# Patient Record
Sex: Female | Born: 1980 | Race: White | Hispanic: No | Marital: Married
Health system: Southern US, Community
[De-identification: ages and names within clinical notes are randomized; demographics above are authoritative.]

---

## 2009-07-26 ENCOUNTER — Emergency Department: Payer: Self-pay | Admitting: Unknown Physician Specialty

## 2010-02-08 ENCOUNTER — Emergency Department: Payer: Self-pay | Admitting: Emergency Medicine

## 2011-08-10 IMAGING — CT CT ABD-PELV W/O CM
1 of 2 series · 15 of 32 positions shown, 19 images · non-contrast
Comparison: none

REASON FOR EXAM: (1) LUQ pain; (2) same
COMMENTS:   LMP: Now

[Series 2: soft tissue · axial · 0.77mm/px · z∈[-684,-285]mm · 15 of 147 slices shown, 19 images]
[im 7/147  soft-tissue]
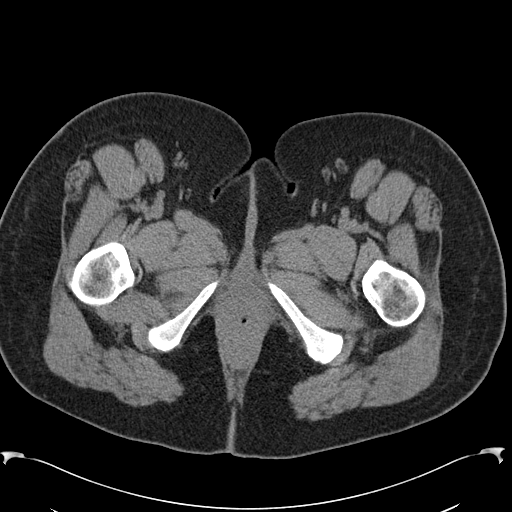
[im 7/147  bone]
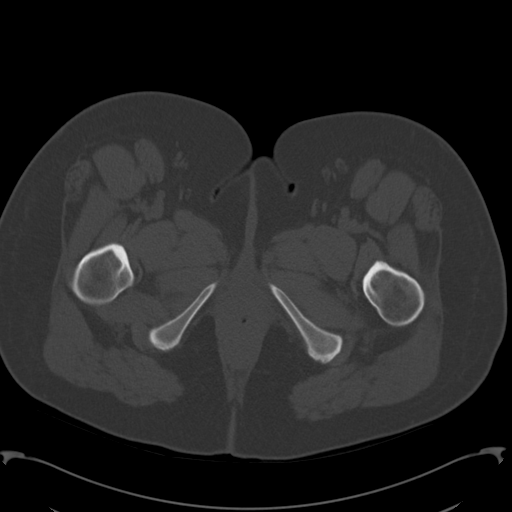
[im 19/147  soft-tissue]
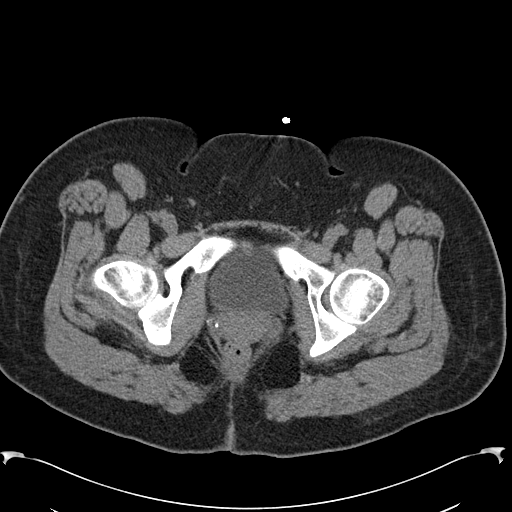
[im 31/147  soft-tissue]
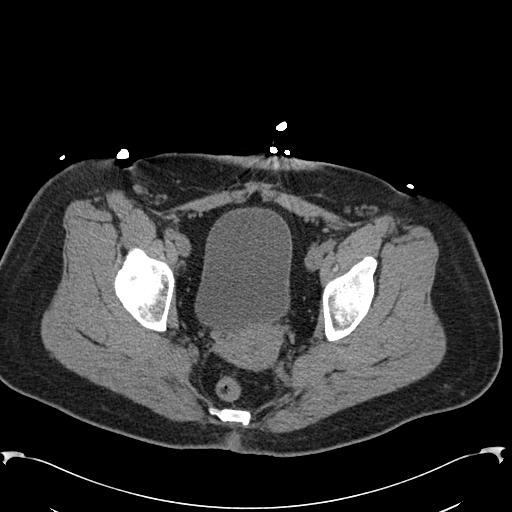
[im 43/147  soft-tissue]
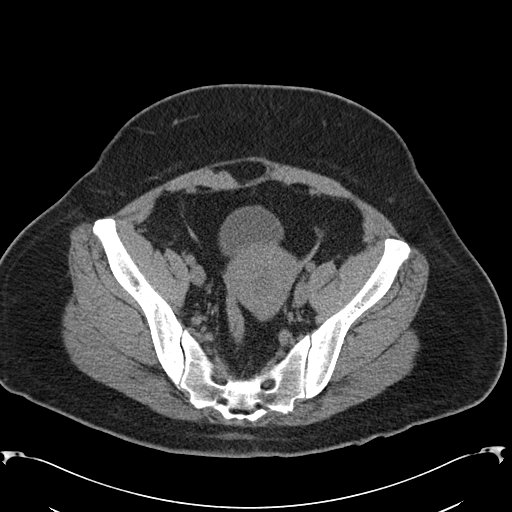
[im 49/147  soft-tissue]
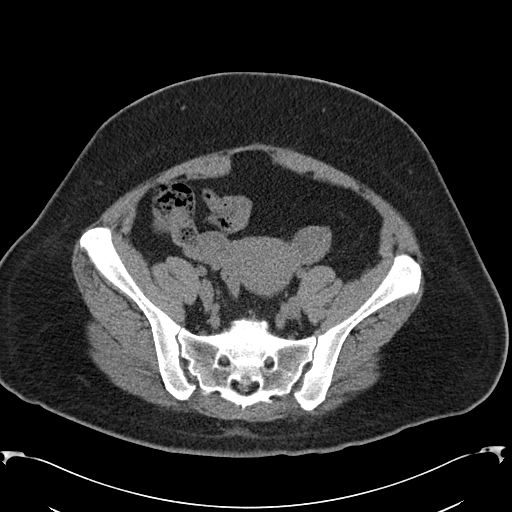
[im 61/147  soft-tissue]
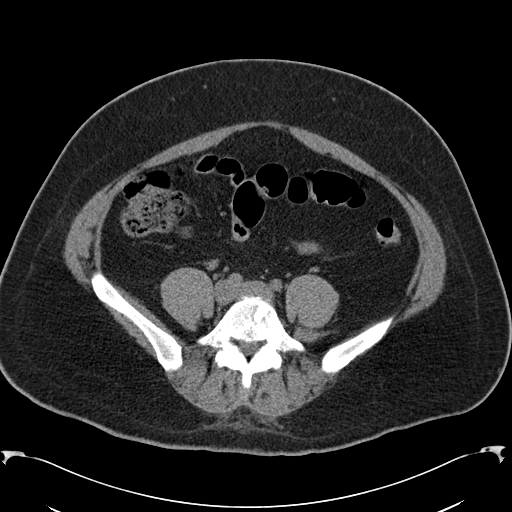
[im 74/147  soft-tissue]
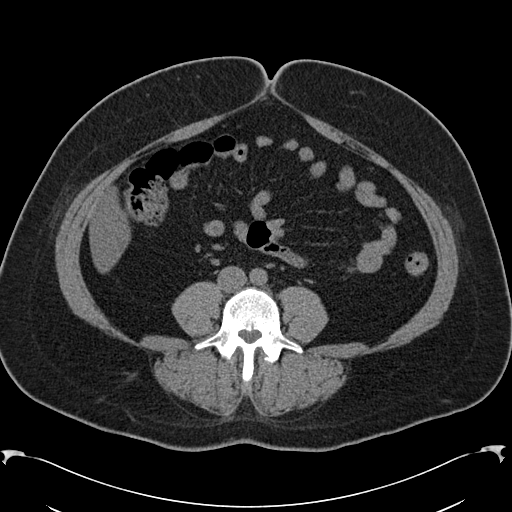
[im 86/147  soft-tissue]
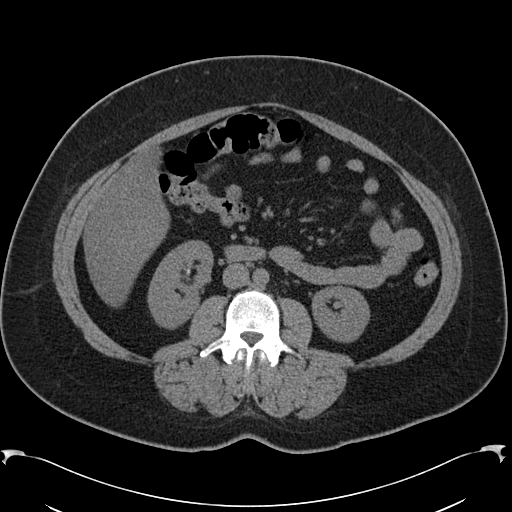
[im 98/147  soft-tissue]
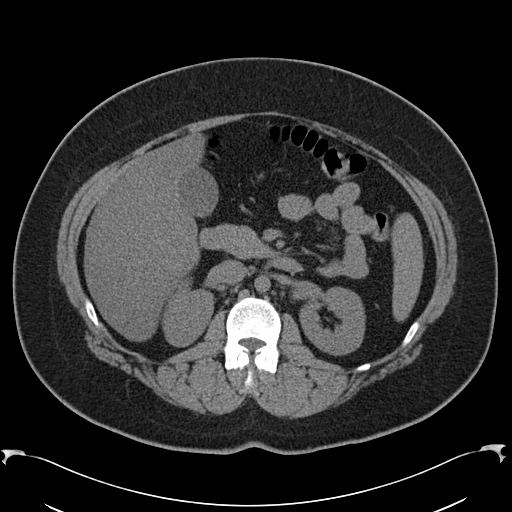
[im 98/147  bone]
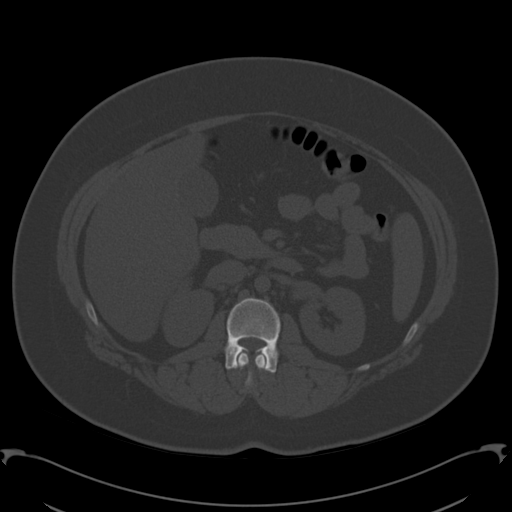
[im 104/147  soft-tissue]
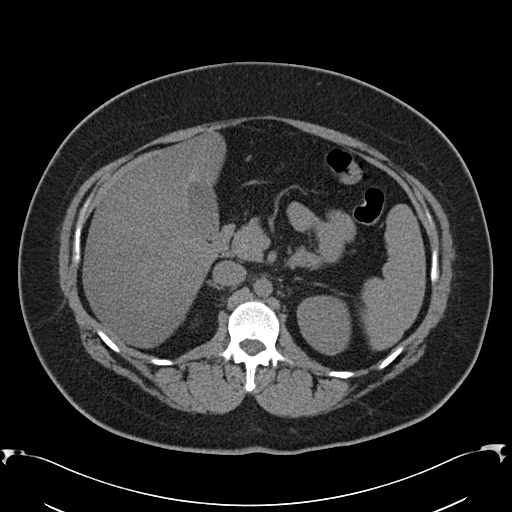
[im 116/147  soft-tissue]
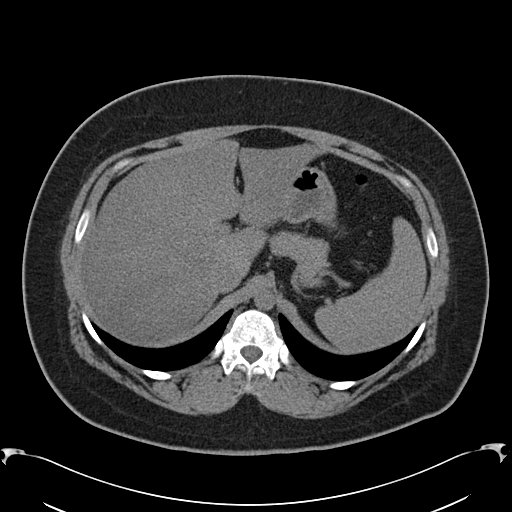
[im 122/147  lung]
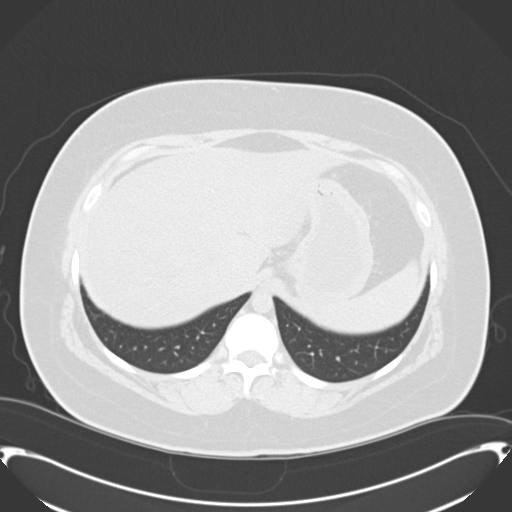
[im 128/147  soft-tissue]
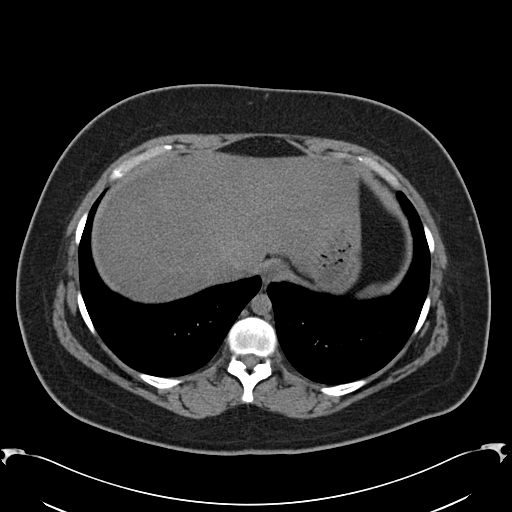
[im 128/147  lung]
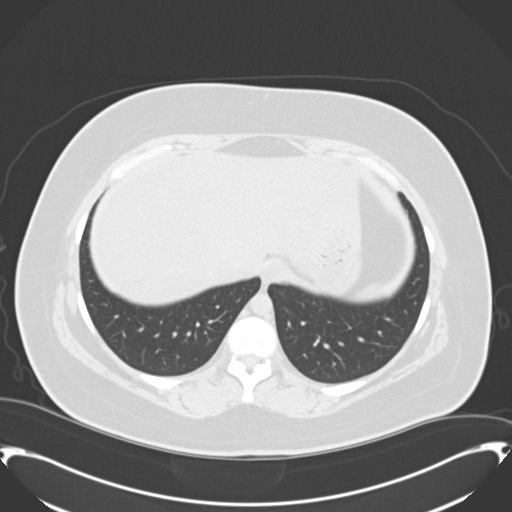
[im 134/147  lung]
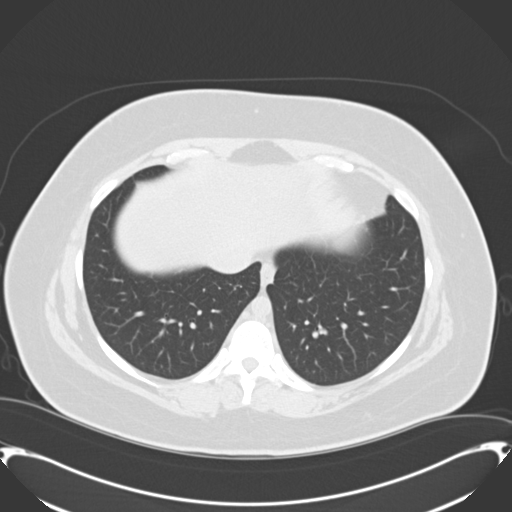
[im 140/147  soft-tissue]
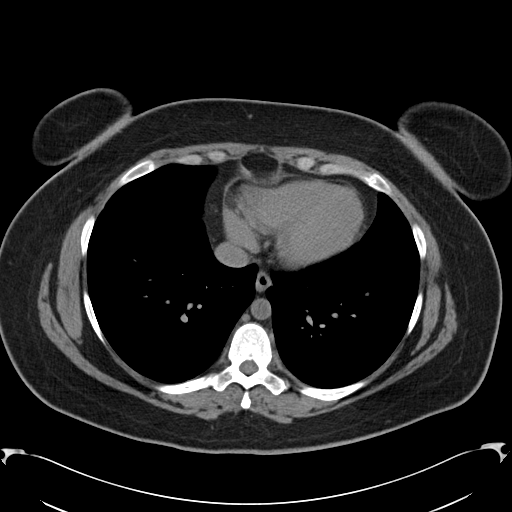
[im 140/147  lung]
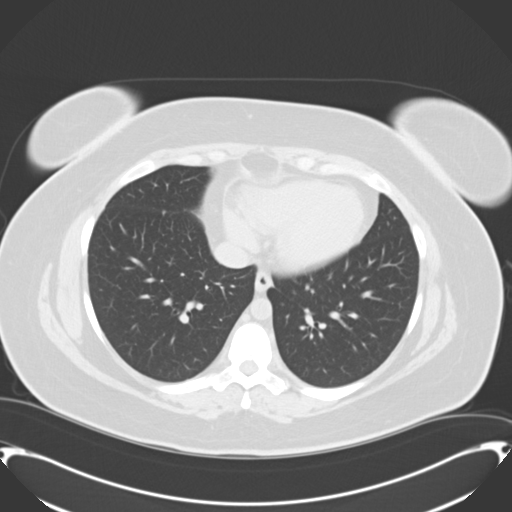

[15 of 32 positions shown; findings below may reference images not displayed]

PROCEDURE:     CT  - CT ABDOMEN AND PELVIS W[DATE]  [DATE]

RESULT:     Axial noncontrast CT scanning was performed through the abdomen
and pelvis at 3 mm intervals and slice thicknesses. Review of multiplanar
reconstructed images was performed separately on the VIA monitor.

The kidneys are normal in contour. There is a tiny stone measuring
approximately 1 x 2 mm in a lower pole calyx on the right. There is no
hydronephrosis nor hydroureter. There are phleboliths within the pelvis.
There is a tiny calcification along the right anterolateral aspect of the
bladder which also is likely a phlebolith. The uterus and adnexal structures
appear normal for age. The rectosigmoid colon exhibits no acute abnormality.
The partially distended urinary bladder is normal. The liver exhibits
decreased density consistent with fatty infiltration. The gallbladder is
adequately distended with no evidence of stones. The spleen, pancreas,
nondistended stomach, and adrenal glands appear normal for the noncontrast
exam. The caliber of the abdominal aorta is normal. I see no intraperitoneal
nor retroperitoneal lymphadenopathy. The lung bases are clear. The lumbar
vertebral bodies are preserved in height.
IMPRESSION: 1. I do not see evidence of obstructing urinary tract stones. There is a
nonobstructing lower pole calyceal stone on the right. There are numerous
phleboliths in the pelvis some of which lie near the ureterovesical
junction. No hydroureter is demonstrated.
2. There is fullness of the adnexal structures bilaterally likely secondary
to cystic ovarian processes. Pelvic ultrasound may be useful if the
patient's symptoms remain unexplained.
3. There is fatty infiltrative change of the liver.

A preliminary report was sent to the [HOSPITAL] the conclusion
of the study.

## 2013-12-30 ENCOUNTER — Emergency Department (HOSPITAL_COMMUNITY)
Admission: EM | Admit: 2013-12-30 | Discharge: 2013-12-30 | Disposition: A | Discharge status: Home / Self Care | Payer: Medicare Other | Attending: Emergency Medicine | Admitting: Emergency Medicine

## 2013-12-30 ENCOUNTER — Emergency Department (HOSPITAL_COMMUNITY): Payer: Medicare Other

## 2013-12-30 DIAGNOSIS — S79929A Unspecified injury of unspecified thigh, initial encounter: Principal | ICD-10-CM

## 2013-12-30 DIAGNOSIS — X500XXA Overexertion from strenuous movement or load, initial encounter: Secondary | ICD-10-CM | POA: Insufficient documentation

## 2013-12-30 DIAGNOSIS — Y9389 Activity, other specified: Secondary | ICD-10-CM | POA: Diagnosis not present

## 2013-12-30 DIAGNOSIS — M25552 Pain in left hip: Secondary | ICD-10-CM

## 2013-12-30 DIAGNOSIS — S79919A Unspecified injury of unspecified hip, initial encounter: Secondary | ICD-10-CM | POA: Diagnosis not present

## 2013-12-30 DIAGNOSIS — Y9289 Other specified places as the place of occurrence of the external cause: Secondary | ICD-10-CM | POA: Insufficient documentation

## 2013-12-30 MED ORDER — OXYCODONE-ACETAMINOPHEN 5-325 MG PO TABS: 1.0000 | ORAL_TABLET | Freq: Four times a day (QID) | ORAL | Status: AC | PRN

## 2013-12-30 MED ORDER — OXYCODONE-ACETAMINOPHEN 5-325 MG PO TABS
1.0000 | ORAL_TABLET | Freq: Once | ORAL | Status: AC
  Administered 2013-12-30: 1 via ORAL
  Filled 2013-12-30: qty 1

## 2013-12-30 NOTE — ED Notes (Signed)
Pt states she has a ride home. 

## 2013-12-30 NOTE — Discharge Instructions (Signed)
Please follow up with your primary care physician in 1-2 days. If you do not have one please [facility] listed above. Please follow up with your  Please take pain medication and/or muscle relaxants as prescribed and as needed for pain. Please do not drive on narcotic pain medication or on muscle relaxants. Please read all discharge instructions and return precautions.   Hip Pain Your hip is the joint between your upper legs and your lower pelvis. The bones, cartilage, tendons, and muscles of your hip joint perform a lot of work each day supporting your body weight and allowing you to move around. Hip pain can range from a minor ache to severe pain in one or both of your hips. Pain may be felt on the inside of the hip joint near the groin, or the outside near the buttocks and upper thigh. You may have swelling or stiffness as well.  HOME CARE INSTRUCTIONS   Take medicines only as directed by your health care provider.  Apply ice to the injured area:  Put ice in a plastic bag.  Place a towel between your skin and the bag.  Leave the ice on for 15-20 minutes at a time, 3-4 times a day.  Keep your leg raised (elevated) when possible to lessen swelling.  Avoid activities that cause pain.  Follow specific exercises as directed by your health care provider.  Sleep with a pillow between your legs on your most comfortable side.  Record how often you have hip pain, the location of the pain, and what it feels like. SEEK MEDICAL CARE IF:   You are unable to put weight on your leg.  Your hip is red or swollen or very tender to touch.  Your pain or swelling continues or worsens after 1 week.  You have increasing difficulty walking.  You have a fever. SEEK IMMEDIATE MEDICAL CARE IF:   You have fallen.  You have a sudden increase in pain and swelling in your hip. MAKE SURE YOU:   Understand these instructions.  Will watch your condition.  Will get  help right away if you are not doing well or get worse. Document Released: 10/23/2009 Document Revised: 09/19/2013 Document Reviewed: 12/30/2012 [facility] Patient Information 2015 [employer] [place]. This information is not intended to replace advice given to you by your health care provider. Make sure you discuss any questions you have with your health care provider.

## 2013-12-30 NOTE — ED Notes (Signed)
Pt c/o L hip pain and injury after leaning and bending over. Pt states she felt a "snap" in her L hip. Pt reports that she had "decompression surgery" to her L hip in May. Pt ambulatory to exam room with independent gait.

## 2013-12-30 NOTE — ED Provider Notes (Signed)
CSN: [id number]     Arrival date & time 12/30/13  1643 History   This chart was scribed for non-physician practitioner, Tyler Deis, PA-C, working with Mirian Mo, MD by Milly Jakob, ED Scribe. The patient was seen in room WTR5/WTR5. Patient's care was started at 5:40 PM.    Chief Complaint  Patient presents with  . Hip Injury   The history is provided by the patient. No language interpreter was used.   HPI Comments: Jennifer Sampson is a 33 y.o. 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 She reports that earlier today she was leaning and lifting when she felt her hip pop. She reports difficulty walking. She denies numbness or weakness in her left leg. She denies any recent hip injections. She reports that she has a follow up scheduled with her hip doctor. She reports allergy to Benadryl.   No past medical history on file. No past surgical history on file. No family history on file. History  Substance Use Topics  . Smoking status: Not on file  . Smokeless tobacco: Not on file  . Alcohol Use: Not on file   OB History   No data available     Review of Systems  Musculoskeletal: Positive for arthralgias.  All other systems reviewed and are negative.   Allergies  Review of patient's allergies indicates not on file.  Home Medications   Prior to Admission medications   Medication Sig Start Date End Date Taking? Authorizing Provider  oxyCODONE-acetaminophen (PERCOCET) 5-325 MG per tablet Take 1-2 tablets by mouth every 6 (six) hours as needed for severe pain. 12/30/13   Lise Auer Daden Mahany, PA-C   Triage Vitals: BP 116/70  Pulse 96  Temp(Src) 98.2 F (36.8 C) (Oral)  Resp 16  SpO2 100%  LMP 12/17/2013 Physical Exam  Nursing note and vitals  reviewed. Constitutional: She is oriented to person, place, and time. She appears well-developed and well-nourished. No distress.  HENT:  Head: Normocephalic and atraumatic.  Right Ear: External ear normal.  Left Ear: External ear normal.  Nose: Nose normal.  Mouth/Throat: Oropharynx is clear and moist.  Eyes: Conjunctivae are normal.  Neck: Normal range of motion. Neck supple.  Cardiovascular: Normal rate, regular rhythm, normal heart sounds and intact distal pulses.   Pulmonary/Chest: Effort normal and breath sounds normal. No respiratory distress.  Abdominal: Soft. There is no tenderness.  Musculoskeletal: Normal range of motion. She exhibits tenderness.       Right hip: Normal.       Left hip: She exhibits tenderness. She exhibits normal range of motion, normal strength, no swelling and no deformity.  Neurological: She is alert and oriented to person, place, and time. She has normal strength. No sensory deficit. Gait normal. GCS eye subscore is 4. GCS verbal subscore is 5. GCS motor subscore is 6.  Skin: Skin is warm and dry. She is not diaphoretic. No erythema.  Psychiatric: She has a normal mood and affect.    ED Course  Procedures (including critical care time) Medications  oxyCODONE-acetaminophen (PERCOCET/ROXICET) 5-325 MG per tablet 1 tablet (1 tablet Oral Given 12/30/13 1758)    5:45 PM-Discussed treatment plan which includes X-Ray with pt at bedside and pt agreed to plan.   Labs Review Labs Reviewed -  No data to display  Imaging Review Dg Hip Complete Left  12/30/2013   CLINICAL DATA:  Left hip pain, No acute trauma  EXAM: LEFT HIP - COMPLETE 2+ VIEW  COMPARISON:  None.  FINDINGS: Hips are located. No evidence pelvic fracture or sacral fracture. Dedicated view of the left hip demonstrates no fracture dislocation.  IMPRESSION: No osseous abnormality of the pelvis or left hip.   Electronically Signed   By: Genevive BiStewart  Edmunds M.D.   On: 12/30/2013 17:29     EKG  Interpretation None      MDM   Final diagnoses:  Left hip pain    Filed Vitals:   12/30/13 1658  BP: 116/70  Pulse: 96  Temp: 98.2 F (36.8 C)  Resp: 16   Afebrile, NAD, non-toxic appearing, AAOx4. Neurovascularly intact. Normal sensation. No erythema or warmth to Left hip. Patient X-Ray negative for obvious fracture or dislocation. Pain managed in ED. Pt advised to follow up with her orthopedist if symptoms persist. Return precautions discussed. Patient is agreeable to plan. Patient is stable at time of discharge     I personally performed the services described in this documentation, which was scribed in my presence. The recorded information has been reviewed and is accurate.       [name], PA-C 12/30/13 2016

## 2013-12-31 NOTE — ED Provider Notes (Signed)
Medical screening examination/treatment/procedure(s) were performed by non-physician practitioner and as supervising physician I was immediately available for consultation/collaboration.   EKG Interpretation None        Mirian MoMatthew Gentry, MD 12/31/13 1626

## 2015-06-30 IMAGING — CR DG HIP (WITH OR WITHOUT PELVIS) 2-3V*L*
3 series · 3 of 3 positions shown · non-contrast
Comparison: None.

CLINICAL DATA: Left hip pain, No acute trauma

EXAM:
LEFT HIP - COMPLETE 2+ VIEW

[t pelvis ap]
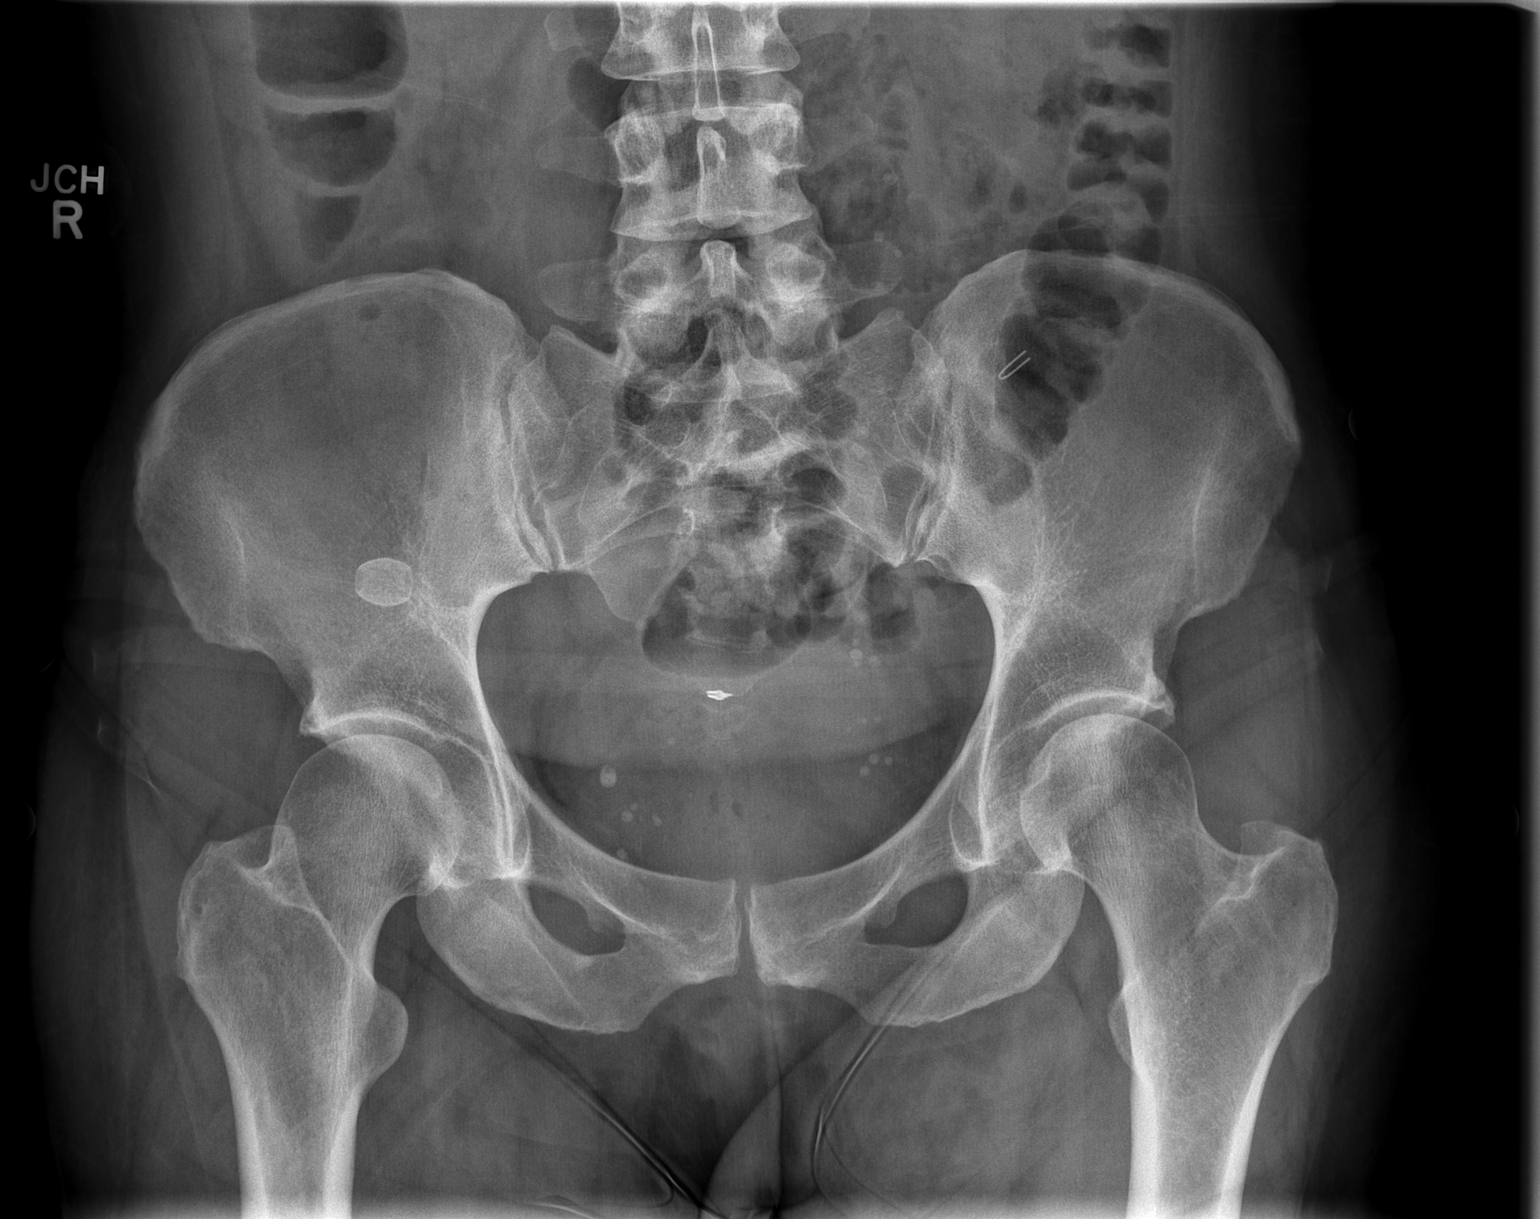

[t hip frog leg left (1 of 2)]
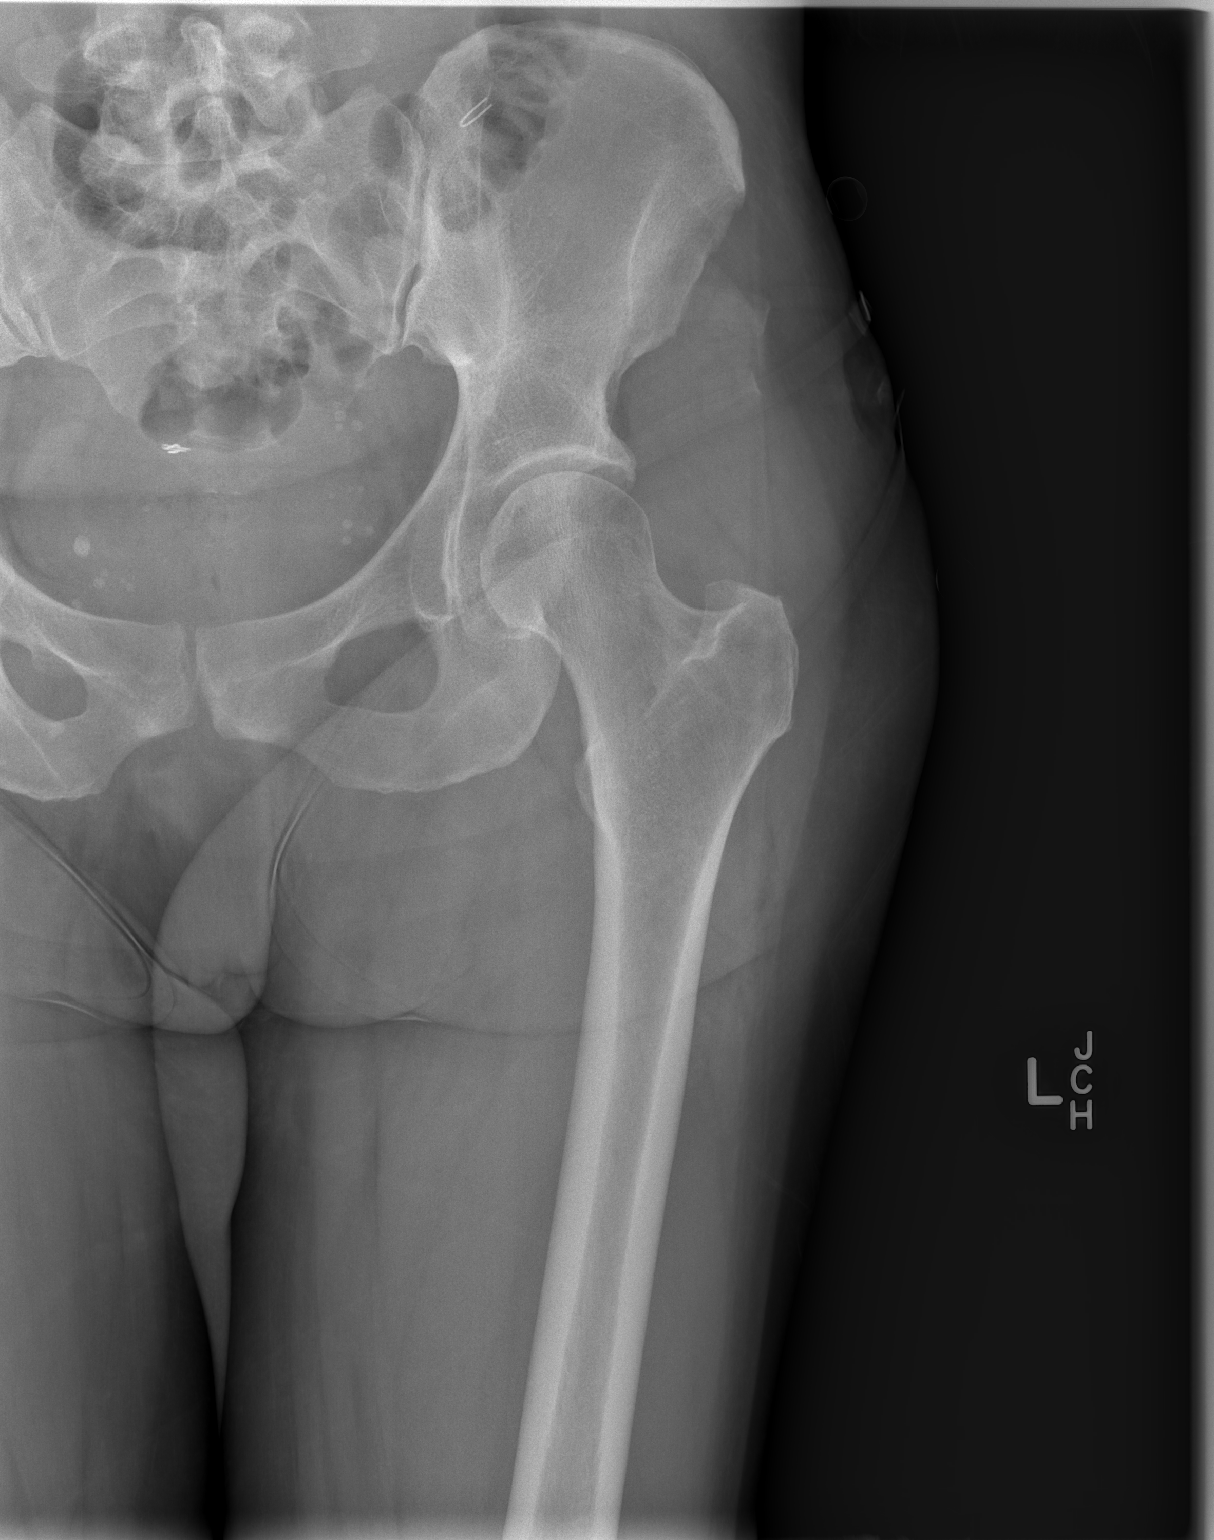

[t hip frog leg left (2 of 2)]
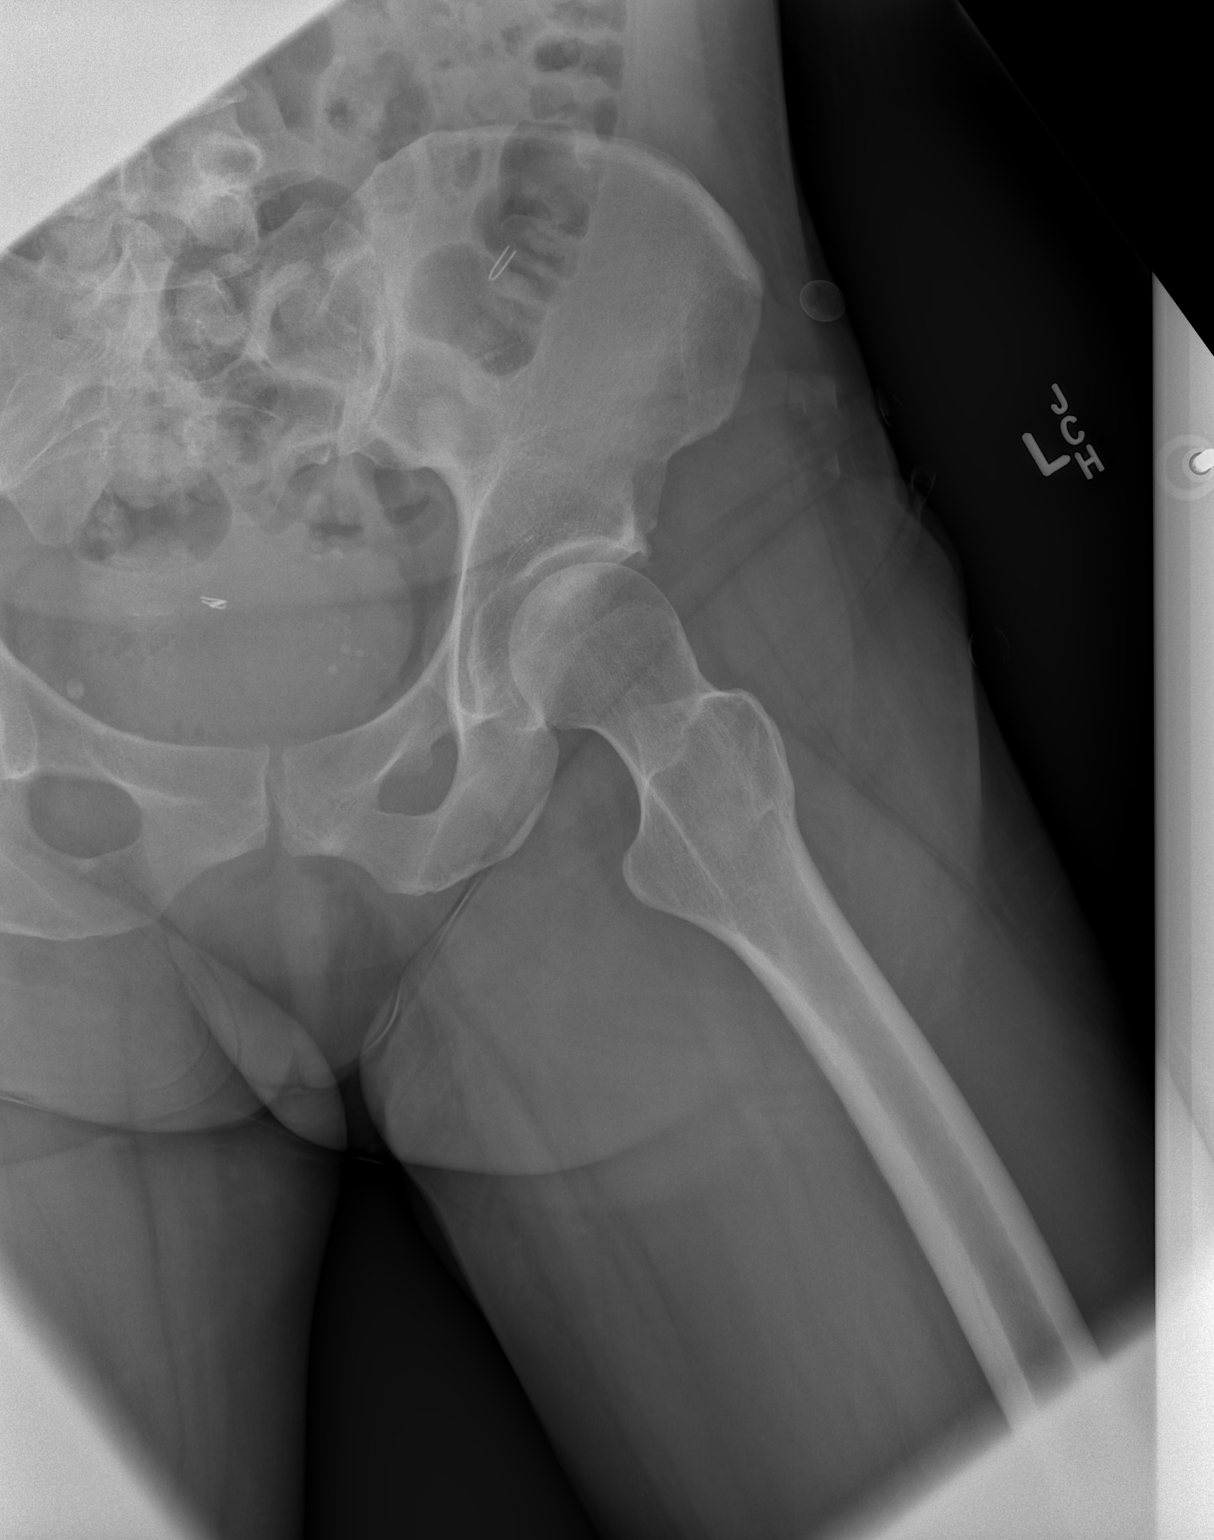

[3 of 3 positions shown; findings below may reference images not displayed]

FINDINGS: Hips are located. No evidence pelvic fracture or sacral fracture.
Dedicated view of the left hip demonstrates no fracture dislocation.
IMPRESSION: No osseous abnormality of the pelvis or left hip.

## 2018-10-18 DEATH — deceased
